# Patient Record
Sex: Male | Born: 1980 | Race: White | Hispanic: No
Health system: Midwestern US, Community
[De-identification: ages and names within clinical notes are randomized; demographics above are authoritative.]

## PROBLEM LIST (undated history)

## (undated) ENCOUNTER — Emergency Department (HOSPITAL_COMMUNITY): Admission: EM | Payer: No Typology Code available for payment source | Source: Home / Self Care

## (undated) DIAGNOSIS — E785 Hyperlipidemia, unspecified: Secondary | ICD-10-CM

## (undated) DIAGNOSIS — I10 Essential (primary) hypertension: Secondary | ICD-10-CM

## (undated) HISTORY — PX: APPENDECTOMY: SHX54

---

## 2004-05-21 ENCOUNTER — Emergency Department: Payer: Self-pay | Admitting: Emergency Medicine

## 2006-10-06 ENCOUNTER — Ambulatory Visit: Payer: Self-pay | Admitting: Internal Medicine

## 2006-12-18 ENCOUNTER — Emergency Department: Payer: Self-pay | Admitting: Emergency Medicine

## 2007-06-27 ENCOUNTER — Ambulatory Visit: Payer: Self-pay | Admitting: Family Medicine

## 2007-11-18 ENCOUNTER — Emergency Department: Payer: Self-pay | Admitting: Emergency Medicine

## 2010-03-12 ENCOUNTER — Emergency Department: Payer: Self-pay | Admitting: Emergency Medicine

## 2010-05-30 ENCOUNTER — Emergency Department: Payer: Self-pay | Admitting: Emergency Medicine

## 2010-06-18 ENCOUNTER — Emergency Department: Payer: Self-pay | Admitting: Emergency Medicine

## 2010-06-19 ENCOUNTER — Emergency Department: Payer: Self-pay | Admitting: Emergency Medicine

## 2011-03-07 ENCOUNTER — Emergency Department: Payer: Self-pay | Admitting: Unknown Physician Specialty

## 2014-06-08 ENCOUNTER — Ambulatory Visit: Payer: Self-pay | Admitting: Family Medicine

## 2018-12-03 ENCOUNTER — Other Ambulatory Visit: Payer: Self-pay

## 2018-12-03 DIAGNOSIS — Z20822 Contact with and (suspected) exposure to covid-19: Secondary | ICD-10-CM

## 2018-12-04 LAB — NOVEL CORONAVIRUS, NAA: SARS-CoV-2, NAA: NOT DETECTED

## 2019-02-22 ENCOUNTER — Other Ambulatory Visit: Payer: Self-pay

## 2019-02-22 DIAGNOSIS — Z20822 Contact with and (suspected) exposure to covid-19: Secondary | ICD-10-CM

## 2019-02-23 LAB — NOVEL CORONAVIRUS, NAA: SARS-CoV-2, NAA: DETECTED — AB

## 2019-08-30 ENCOUNTER — Ambulatory Visit (HOSPITAL_COMMUNITY)
Admission: EM | Admit: 2019-08-30 | Discharge: 2019-08-30 | Discharge status: Against Medical Advice | Payer: No Typology Code available for payment source

## 2019-08-30 ENCOUNTER — Other Ambulatory Visit: Payer: Self-pay

## 2021-05-14 NOTE — Telephone Encounter (Signed)
Pt is wanting to know if he can get blood work ordered before his appt in April.

## 2021-05-15 NOTE — Telephone Encounter (Signed)
Patient notified that labs will be done the day of appointment

## 2021-05-22 ENCOUNTER — Other Ambulatory Visit: Payer: Self-pay

## 2021-05-22 ENCOUNTER — Emergency Department (HOSPITAL_COMMUNITY)
Admission: EM | Admit: 2021-05-22 | Discharge: 2021-05-23 | Disposition: A | Discharge status: Home / Self Care | Payer: Self-pay | Attending: Emergency Medicine | Admitting: Emergency Medicine

## 2021-05-22 ENCOUNTER — Encounter (HOSPITAL_COMMUNITY): Payer: Self-pay

## 2021-05-22 ENCOUNTER — Emergency Department (HOSPITAL_COMMUNITY): Payer: Self-pay

## 2021-05-22 DIAGNOSIS — R0789 Other chest pain: Secondary | ICD-10-CM | POA: Insufficient documentation

## 2021-05-22 DIAGNOSIS — R42 Dizziness and giddiness: Secondary | ICD-10-CM | POA: Insufficient documentation

## 2021-05-22 DIAGNOSIS — R1013 Epigastric pain: Secondary | ICD-10-CM | POA: Insufficient documentation

## 2021-05-22 DIAGNOSIS — R079 Chest pain, unspecified: Secondary | ICD-10-CM

## 2021-05-22 HISTORY — DX: Essential (primary) hypertension: I10

## 2021-05-22 LAB — CBC
HCT: 45 % (ref 39.0–52.0)
Hemoglobin: 14.4 g/dL (ref 13.0–17.0)
MCH: 27 pg (ref 26.0–34.0)
MCHC: 32 g/dL (ref 30.0–36.0)
MCV: 84.4 fL (ref 80.0–100.0)
Platelets: 227 10*3/uL (ref 150–400)
RBC: 5.33 MIL/uL (ref 4.22–5.81)
RDW: 13.5 % (ref 11.5–15.5)
WBC: 6 10*3/uL (ref 4.0–10.5)
nRBC: 0 % (ref 0.0–0.2)

## 2021-05-22 LAB — BASIC METABOLIC PANEL
Anion gap: 7 (ref 5–15)
BUN: 13 mg/dL (ref 6–20)
CO2: 22 mmol/L (ref 22–32)
Calcium: 9.1 mg/dL (ref 8.9–10.3)
Chloride: 107 mmol/L (ref 98–111)
Creatinine, Ser: 0.98 mg/dL (ref 0.61–1.24)
GFR, Estimated: >60 mL/min (ref >60)
Glucose, Bld: 91 mg/dL (ref 70–99)
Potassium: 3.5 mmol/L (ref 3.5–5.1)
Sodium: 136 mmol/L (ref 135–145)

## 2021-05-22 LAB — TROPONIN I (HIGH SENSITIVITY): Troponin I (High Sensitivity): 3 ng/L (ref <18)

## 2021-05-22 NOTE — ED Triage Notes (Signed)
Ambulatory to ED with c/o chest pain and lightheadness x 2 hours. Denies hx of same.

## 2021-05-23 LAB — TROPONIN I (HIGH SENSITIVITY): Troponin I (High Sensitivity): 3 ng/L (ref <18)

## 2021-05-23 LAB — D-DIMER, QUANTITATIVE: D-Dimer, Quant: <0.27 ug/mL-FEU (ref 0.00–0.50)

## 2021-05-23 MED ORDER — ALUM & MAG HYDROXIDE-SIMETH 400-400-40 MG/5ML PO SUSP: 15.0000 mL | Freq: Four times a day (QID) | ORAL | 0 refills | Status: DC | PRN

## 2021-05-23 MED ORDER — LIDOCAINE VISCOUS HCL 2 % MT SOLN
15.0000 mL | Freq: Once | OROMUCOSAL | Status: AC
  Administered 2021-05-23: 15 mL via ORAL
  Filled 2021-05-23: qty 15

## 2021-05-23 MED ORDER — ALUM & MAG HYDROXIDE-SIMETH 200-200-20 MG/5ML PO SUSP
30.0000 mL | Freq: Once | ORAL | Status: AC
  Administered 2021-05-23: 30 mL via ORAL
  Filled 2021-05-23: qty 30

## 2021-05-23 MED ORDER — PANTOPRAZOLE SODIUM 20 MG PO TBEC: 20.0000 mg | DELAYED_RELEASE_TABLET | Freq: Every day | ORAL | 0 refills | Status: DC

## 2021-05-23 MED ORDER — PANTOPRAZOLE SODIUM 40 MG PO TBEC
40.0000 mg | DELAYED_RELEASE_TABLET | Freq: Once | ORAL | Status: AC
Start: 2021-05-23 — End: 2021-05-23
  Administered 2021-05-23: 40 mg via ORAL
  Filled 2021-05-23: qty 1

## 2021-05-23 NOTE — ED Provider Notes (Signed)
?[facility] EMERGENCY DEPARTMENT ?Provider Note ? ? ?CSN: [id number] ?Arrival date & time: 05/22/21  2029 ? ?  ? ?History ? ?Chief Complaint  ?Patient presents with  ? Chest Pain  ? ? ?Gabriel Walton is a 41 y.o. male. ? ?41 year old male who presents emerged from today with chest pain and dizziness.  Patient states that he was dizzy for a couple days but seems to have resolved now.  Patient states that he felt a bit lightheaded but once again does not have that at this time. ? ?What brought him in today is that he woke up with some chest discomfort.  Patient states he felt some pressure in his epigastric area like he had to burp.  He stated he did burp and it seemed to feel little bit better and then he took a Tums admittedly bit better but still little bit of lingering pain.  Secondary to his medical history he came here to get evaluated.  No nausea, vomiting, current lightheadedness, diaphoresis or other associated symptoms.  No known cardiac disease. ? ? ?Chest Pain ? ?  ? ?Home Medications ?Prior to Admission medications   ?Medication Sig Start Date End Date Taking? Authorizing Provider  ?alum & mag hydroxide-simeth (MAALOX PLUS) 400-400-40 MG/5ML suspension Take 15 mLs by mouth every 6 (six) hours as needed for indigestion. 05/23/21   Trevis Eden, Barbara Cower, MD  ?pantoprazole (PROTONIX) 20 MG tablet Take 1 tablet (20 mg total) by mouth daily for 14 days. 05/23/21 06/06/21  Jacqualin Shirkey, Barbara Cower, MD  ?   ? ?Allergies    ?Patient has no known allergies.   ? ?Review of Systems   ?Review of Systems  ?Cardiovascular:  Positive for chest pain.  ? ?Physical Exam ?Updated Vital Signs ?BP (!) 164/106   Pulse (!) 57   Temp 97.7 ?F (36.5 ?C) (Oral)   Resp 18   Ht 6\' 4"  (1.93 m)   Wt 129.3 kg   SpO2 97%   BMI 34.69 kg/m?  ?Physical Exam ?Vitals and nursing note reviewed.  ?Constitutional:   ?   Appearance: He is well-developed.  ?HENT:  ?   Head: Normocephalic and atraumatic.  ?Cardiovascular:  ?   Rate and Rhythm: Normal rate.   ?Pulmonary:  ?   Effort: Pulmonary effort is normal. No respiratory distress.  ?Chest:  ?   Chest wall: No mass, deformity or tenderness.  ?Abdominal:  ?   General: There is no distension.  ?   Palpations: Abdomen is soft.  ?Musculoskeletal:     ?   General: Normal range of motion.  ?   Cervical back: Normal range of motion.  ?Skin: ?   General: Skin is warm and dry.  ?Neurological:  ?   General: No focal deficit present.  ?   Mental Status: He is alert.  ? ? ?ED Results / Procedures / Treatments   ?Labs ?(all labs ordered are listed, but only abnormal results are displayed) ?Labs Reviewed  ?BASIC METABOLIC PANEL  ?CBC  ?D-DIMER, QUANTITATIVE  ?TROPONIN I (HIGH SENSITIVITY)  ?TROPONIN I (HIGH SENSITIVITY)  ? ? ?EKG ?EKG Interpretation ? ?Date/Time:  Tuesday May 22 2021 20:37:31 EST ?Ventricular Rate:  65 ?PR Interval:  202 ?QRS Duration: 78 ?QT Interval:  414 ?QTC Calculation: 430 ?R Axis:   48 ?Text Interpretation: Normal sinus rhythm Normal ECG When compared with ECG of 12-Mar-2010 19:21, No significant change was found Confirmed by 14-Mar-2010 [id number]) on 05/23/2021 12:10:24 AM ? ?Radiology ?DG Chest 2 View ? ?Result  Date: 05/22/2021 ?CLINICAL DATA:  Chest pain.  Lightheadedness. EXAM: CHEST - 2 VIEW COMPARISON:  None. FINDINGS: Cardiac silhouette and mediastinal contours are within normal limits. The lungs are clear. No pleural effusion or pneumothorax. No acute skeletal abnormality. IMPRESSION: No active cardiopulmonary disease. Electronically Signed   By: Neita Garnet M.D.   On: 05/22/2021 20:55   ? ?Procedures ?Procedures  ? ? ?Medications Ordered in ED ?Medications  ?alum & mag hydroxide-simeth (MAALOX/MYLANTA) 200-200-20 MG/5ML suspension 30 mL (30 mLs Oral Given 05/23/21 0215)  ?  And  ?lidocaine (XYLOCAINE) 2 % viscous mouth solution 15 mL (15 mLs Oral Given 05/23/21 0215)  ?pantoprazole (PROTONIX) EC tablet 40 mg (40 mg Oral Given 05/23/21 0314)  ? ? ?ED Course/ Medical Decision Making/ A&P ?  ?                         ?Medical Decision Making ?Amount and/or Complexity of Data Reviewed ?Labs: ordered. ?Radiology: ordered. ? ?Risk ?OTC drugs. ?Prescription drug management. ? ? ?Symptoms fully resolved with maalox and protonix. ACS workup reassuring. Doubt PE. Doubt infection. Overall patient appears well. Stable for discharge and pcp follow up. ? ? ?Final Clinical Impression(s) / ED Diagnoses ?Final diagnoses:  ?Nonspecific chest pain  ? ? ?Rx / DC Orders ?ED Discharge Orders   ? ?      Ordered  ?  pantoprazole (PROTONIX) 20 MG tablet  Daily,   Status:  Discontinued       ? 05/23/21 0309  ?  alum & mag hydroxide-simeth (MAALOX PLUS) 400-400-40 MG/5ML suspension  Every 6 hours PRN,   Status:  Discontinued       ? 05/23/21 0309  ?  alum & mag hydroxide-simeth (MAALOX PLUS) 400-400-40 MG/5ML suspension  Every 6 hours PRN       ? 05/23/21 0312  ?  pantoprazole (PROTONIX) 20 MG tablet  Daily       ? 05/23/21 7412  ? ?  ?  ? ?  ? ? ?  ?Marily Memos, MD ?05/23/21 [phone] ? ?

## 2021-07-10 ENCOUNTER — Encounter: Attending: Family Medicine | Primary: Family Medicine

## 2021-10-15 ENCOUNTER — Emergency Department (HOSPITAL_COMMUNITY)
Admission: EM | Admit: 2021-10-15 | Discharge: 2021-10-15 | Disposition: A | Discharge status: Home / Self Care | Payer: Self-pay | Attending: Emergency Medicine | Admitting: Emergency Medicine

## 2021-10-15 ENCOUNTER — Encounter (HOSPITAL_COMMUNITY): Payer: Self-pay | Admitting: Emergency Medicine

## 2021-10-15 DIAGNOSIS — I10 Essential (primary) hypertension: Secondary | ICD-10-CM | POA: Insufficient documentation

## 2021-10-15 DIAGNOSIS — H9209 Otalgia, unspecified ear: Secondary | ICD-10-CM | POA: Insufficient documentation

## 2021-10-15 DIAGNOSIS — J039 Acute tonsillitis, unspecified: Secondary | ICD-10-CM | POA: Insufficient documentation

## 2021-10-15 MED ORDER — AMOXICILLIN-POT CLAVULANATE 875-125 MG PO TABS: 1.0000 | ORAL_TABLET | Freq: Two times a day (BID) | ORAL | 0 refills | Status: AC

## 2021-10-15 MED ORDER — DEXAMETHASONE 4 MG PO TABS
10.0000 mg | ORAL_TABLET | Freq: Once | ORAL | Status: AC
  Administered 2021-10-15: 10 mg via ORAL
  Filled 2021-10-15: qty 3

## 2021-10-15 MED ORDER — AMOXICILLIN-POT CLAVULANATE 875-125 MG PO TABS
1.0000 | ORAL_TABLET | Freq: Once | ORAL | Status: AC
  Administered 2021-10-15: 1 via ORAL
  Filled 2021-10-15: qty 1

## 2021-10-15 MED ORDER — NAPROXEN 500 MG PO TABS: 500.0000 mg | ORAL_TABLET | Freq: Two times a day (BID) | ORAL | 0 refills | Status: DC

## 2021-10-15 MED ORDER — ACETAMINOPHEN 500 MG PO TABS
1000.0000 mg | ORAL_TABLET | Freq: Once | ORAL | Status: AC
Start: 2021-10-15 — End: 2021-10-15
  Administered 2021-10-15: 1000 mg via ORAL
  Filled 2021-10-15: qty 2

## 2021-10-15 NOTE — ED Provider Notes (Signed)
[facility]Y DEPT [facility] Emergency Department Provider Note MRN:  [id number]  Arrival date & time: 10/15/21     Chief Complaint   Otalgia and Sore Throat   History of Present Illness   Gabriel Walton is a 41 y.o. year-old male with a history of hypertension presenting to the ED with chief complaint of sore throat.  Sore throat for the past few days, worse on the right.  Having some ear discomfort as well.  Pain getting much worse this evening, having a lot of pain with swallowing.  No shortness of breath, no fever.  Review of Systems  A thorough review of systems was obtained and all systems are negative except as noted in the HPI and PMH.   Patient's Health History    Past Medical History:  Diagnosis Date   Hypertension     History reviewed. No pertinent surgical history.  History reviewed. No pertinent family history.  Social History   Socioeconomic History   Marital status: Single    Spouse name: Not on file   Number of children: Not on file   Years of education: Not on file   Highest education level: Not on file  Occupational History   Not on file  Tobacco Use   Smoking status: Never   Smokeless tobacco: Never  Substance and Sexual Activity   Alcohol use: Yes   Drug use: Not on file   Sexual activity: Not on file  Other Topics Concern   Not on file  Social History Narrative   Not on file   Social Determinants of Health   Financial Resource Strain: Not on file  Food Insecurity: Not on file  Transportation Needs: Not on file  Physical Activity: Not on file  Stress: Not on file  Social Connections: Not on file  Intimate Partner Violence: Not on file     Physical Exam   Vitals:   10/15/21 0554 10/15/21 0555  BP: (!) 141/94   Pulse: 68 69  Resp: 14   Temp: 98.3 F (36.8 C)   SpO2: 98% 98%    CONSTITUTIONAL: Well-appearing, NAD NEURO/PSYCH:  Alert and oriented x 3, no focal deficits EYES:  eyes equal and reactive ENT/NECK:  no LAD, no  JVD, fullness and erythema to the right peritonsillar region CARDIO: Regular rate, well-perfused, normal S1 and S2 PULM:  CTAB no wheezing or rhonchi GI/GU:  non-distended, non-tender MSK/SPINE:  No gross deformities, no edema SKIN:  no rash, atraumatic   *Additional and/or pertinent findings included in MDM below  Diagnostic and Interventional Summary    EKG Interpretation  Date/Time:    Ventricular Rate:    PR Interval:    QRS Duration:   QT Interval:    QTC Calculation:   R Axis:     Text Interpretation:         Labs Reviewed - No data to display  No orders to display    Medications  dexamethasone (DECADRON) tablet 10 mg (has no administration in time range)  amoxicillin-clavulanate (AUGMENTIN) 875-125 MG per tablet 1 tablet (has no administration in time range)  acetaminophen (TYLENOL) tablet 1,000 mg (has no administration in time range)     Procedures  /  Critical Care Procedures  ED Course and Medical Decision Making  Initial Impression and Ddx Exam suspicious for tonsillitis versus small peritonsillar abscess.  TM on the right generally appears normal.  Normal vital signs, well-appearing, no acute distress, seems appropriate for antibiotics, strict return precautions  Past medical/surgical  history that increases complexity of ED encounter: Hypertension  Interpretation of Diagnostics Laboratory and/or imaging options to aid in the diagnosis/care of the patient were considered.  After careful history and physical examination, it was determined that there was no indication for diagnostics at this time.  Patient Reassessment and Ultimate Disposition/Management     Discharge home  Patient management required discussion with the following services or consulting groups:  None  Complexity of Problems Addressed Acute illness or injury that poses threat of life of bodily function  Additional Data Reviewed and Analyzed Further history obtained  from: None  Additional Factors Impacting ED Encounter Risk Prescriptions  Elmer Sow. Pilar Plate, MD [facility] Emergency Medicine [facility] [email or web address]  Final Clinical Impressions(s) / ED Diagnoses     ICD-10-CM   1. Tonsillitis  J03.90       ED Discharge Orders          Ordered    amoxicillin-clavulanate (AUGMENTIN) 875-125 MG tablet  Every 12 hours        10/15/21 0611    naproxen (NAPROSYN) 500 MG tablet  2 times daily        10/15/21 0998             Discharge Instructions Discussed with and Provided to Patient:    Discharge Instructions      You were evaluated in the Emergency Department and after careful evaluation, we did not find any emergent condition requiring admission or further testing in the hospital.  Your exam/testing today is overall reassuring.  Symptoms seem to be due to a tonsil infection, or possibly a small tonsil abscess.  Take the Augmentin antibiotic as directed.  Use the Naprosyn anti-inflammatory for pain.  Please return to the Emergency Department if you experience any worsening of your condition.   Thank you for allowing [place] to be a part of your care.      Sabas Sous, MD 10/15/21 [phone]

## 2021-10-15 NOTE — Discharge Instructions (Signed)
You were evaluated in the Emergency Department and after careful evaluation, we did not find any emergent condition requiring admission or further testing in the hospital.  Your exam/testing today is overall reassuring.  Symptoms seem to be due to a tonsil infection, or possibly a small tonsil abscess.  Take the Augmentin antibiotic as directed.  Use the Naprosyn anti-inflammatory for pain.  Please return to the Emergency Department if you experience any worsening of your condition.   Thank you for allowing [place] to be a part of your care.

## 2021-10-15 NOTE — ED Triage Notes (Signed)
Pt arrives POV c/o sore throat x2 days now with ear pain that started today.

## 2021-12-03 ENCOUNTER — Ambulatory Visit: Admit: 2021-12-03 | Discharge: 2021-12-03 | Attending: Family Medicine | Primary: Family Medicine

## 2021-12-03 DIAGNOSIS — Z0000 Encounter for general adult medical examination without abnormal findings: Secondary | ICD-10-CM

## 2021-12-03 NOTE — Progress Notes (Signed)
Patient Consent? Y    Site?RIGHT AC     Site Prepped and Cleaned?Y    Needle Gauge Type? STRAIGHT    Needle Gauge Size?22    Pressure Applied to Site?Y    Number of Tubes Collected?1    Number of Attempts?1    Receiving Lab Location?[facility]    Patient Tolerated?Y  DS      Procedure Code 36415

## 2021-12-03 NOTE — Progress Notes (Signed)
Donald Banks (DOB:  11-24-1980) is a 41 y.o. male, here for evaluation of the following chief complaint(s):  Follow-up        ASSESSMENT/PLAN:  1. Annual physical exam  Overview:  Discussed age appropriate labs, screening tests and vaccines. BMI reviewed and discussed. Depression screen reviewed and is negative unless notes above.    2. Screening for hyperlipidemia  -     Comprehensive Metabolic Panel  -     Lipid Panel      No results found for any visits on 12/03/21.    Return in about 1 year (around 12/04/2022).    Vitals:    12/03/21 1306   BP: 118/78   Site: Left Upper Arm   Position: Sitting   Cuff Size: Large Adult   Pulse: 64   Temp: 98.2 F (36.8 C)   TempSrc: Oral   SpO2: 98%   Weight: 221 lb (100.2 kg)   Height: 5\' 10"  (1.778 m)       Physical Exam:  Physical Exam  Constitutional:       Appearance: Normal appearance.   HENT:      Head: Normocephalic and atraumatic.   Eyes:      Extraocular Movements: Extraocular movements intact.      Pupils: Pupils are equal, round, and reactive to light.   Cardiovascular:      Rate and Rhythm: Normal rate and regular rhythm.      Heart sounds: No murmur heard.     No friction rub. No gallop.   Pulmonary:      Effort: Pulmonary effort is normal.      Breath sounds: Normal breath sounds.   Musculoskeletal:         General: No swelling.   Skin:     General: Skin is warm and dry.   Neurological:      Mental Status: He is alert.   Psychiatric:         Mood and Affect: Mood normal.           No current outpatient medications on file.     No current facility-administered medications for this visit.        PHQ-9 Total Score: 1 (12/03/2021  1:06 PM)       No past medical history on file.     No past surgical history on file.    No family history on file.    History of Present Illness: See A/P for details.    Allergies:  No Known Allergies  Health Maintenance   Topic Date Due    COVID-19 Vaccine (1) Never done    Varicella vaccine (1 of 2 - 2-dose childhood series) Never done     Depression Screen  Never done    HIV screen  Never done    Hepatitis C screen  Never done    DTaP/Tdap/Td vaccine (1 - Tdap) Never done    Hepatitis B vaccine (3 of 3 - Hep B Twinrix 3-dose series) 09/30/2005    Diabetes screen  Never done    Lipids  Never done    Flu vaccine (1) 10/23/2021    Orthopox (Smallpox/Monkeypox) Vaccine  Completed    Hepatitis A vaccine  Aged Out    Hib vaccine  Aged Out    HPV vaccine  Aged Out    Meningococcal (ACWY) vaccine  Aged Out    Pneumococcal 0-64 years Vaccine  Aged 12/23/2021 signed by:  --  Jonathon Resides, MD

## 2021-12-04 LAB — COMPREHENSIVE METABOLIC PANEL
ALT: 28 U/L (ref 0–50)
AST: 34 U/L (ref 0–50)
Albumin/Globulin Ratio: 1.5 (ref 1.00–2.70)
Albumin: 4.6 g/dL (ref 3.5–5.2)
Alk Phosphatase: 56 U/L (ref 40–130)
Anion Gap: 13 mmol/L (ref 2–17)
BUN: 12 mg/dL (ref 6–20)
CO2: 26 mmol/L (ref 22–29)
Calcium: 9.4 mg/dL (ref 8.6–10.0)
Chloride: 101 mmol/L (ref 98–107)
Creatinine: 1.1 mg/dL (ref 0.7–1.3)
Est, Glom Filt Rate: 86 mL/min/1.73m (ref >=60)
Globulin: 3 g/dL (ref 1.9–4.4)
Glucose: 109 mg/dL — ABNORMAL HIGH (ref 70–99)
OSMOLALITY CALCULATED: 280 mOsm/kg (ref 270–287)
Potassium: 4.4 mmol/L (ref 3.5–5.3)
Sodium: 140 mmol/L (ref 135–145)
Total Bilirubin: 0.55 mg/dL (ref 0.00–1.20)
Total Protein: 7.6 g/dL (ref 6.4–8.3)

## 2021-12-04 LAB — LIPID PANEL
Chol/HDL Ratio: 3.5 (ref 0.0–4.4)
Cholesterol: 228 mg/dL — ABNORMAL HIGH (ref 100–200)
HDL: 65 mg/dL (ref >=40)
LDL Cholesterol: 131.6 mg/dL — ABNORMAL HIGH (ref 0.0–100.0)
LDL/HDL Ratio: 2
Triglycerides: 157 mg/dL — ABNORMAL HIGH (ref 0–149)
VLDL: 31.4 mg/dL (ref 5.0–40.0)

## 2022-08-14 ENCOUNTER — Emergency Department (HOSPITAL_COMMUNITY)
Admission: EM | Admit: 2022-08-14 | Discharge: 2022-08-14 | Disposition: A | Discharge status: Home / Self Care | Payer: BC Managed Care – PPO | Attending: Emergency Medicine | Admitting: Emergency Medicine

## 2022-08-14 ENCOUNTER — Encounter (HOSPITAL_COMMUNITY): Payer: Self-pay | Admitting: Emergency Medicine

## 2022-08-14 ENCOUNTER — Other Ambulatory Visit: Payer: Self-pay

## 2022-08-14 DIAGNOSIS — W57XXXA Bitten or stung by nonvenomous insect and other nonvenomous arthropods, initial encounter: Secondary | ICD-10-CM | POA: Diagnosis not present

## 2022-08-14 DIAGNOSIS — R112 Nausea with vomiting, unspecified: Secondary | ICD-10-CM | POA: Diagnosis not present

## 2022-08-14 DIAGNOSIS — S41052A Open bite of left shoulder, initial encounter: Secondary | ICD-10-CM | POA: Insufficient documentation

## 2022-08-14 DIAGNOSIS — S40262A Insect bite (nonvenomous) of left shoulder, initial encounter: Secondary | ICD-10-CM

## 2022-08-14 DIAGNOSIS — I10 Essential (primary) hypertension: Secondary | ICD-10-CM | POA: Insufficient documentation

## 2022-08-14 LAB — CBC WITH DIFFERENTIAL/PLATELET
Abs Immature Granulocytes: 0.01 10*3/uL (ref 0.00–0.07)
Basophils Absolute: 0 10*3/uL (ref 0.0–0.1)
Basophils Relative: 1 %
Eosinophils Absolute: 0.2 10*3/uL (ref 0.0–0.5)
Eosinophils Relative: 3 %
HCT: 44.4 % (ref 39.0–52.0)
Hemoglobin: 14.3 g/dL (ref 13.0–17.0)
Immature Granulocytes: 0 %
Lymphocytes Relative: 39 %
Lymphs Abs: 2 10*3/uL (ref 0.7–4.0)
MCH: 26.9 pg (ref 26.0–34.0)
MCHC: 32.2 g/dL (ref 30.0–36.0)
MCV: 83.6 fL (ref 80.0–100.0)
Monocytes Absolute: 0.5 10*3/uL (ref 0.1–1.0)
Monocytes Relative: 10 %
Neutro Abs: 2.5 10*3/uL (ref 1.7–7.7)
Neutrophils Relative %: 47 %
Platelets: 210 10*3/uL (ref 150–400)
RBC: 5.31 MIL/uL (ref 4.22–5.81)
RDW: 13.8 % (ref 11.5–15.5)
WBC: 5.2 10*3/uL (ref 4.0–10.5)
nRBC: 0 % (ref 0.0–0.2)

## 2022-08-14 LAB — BASIC METABOLIC PANEL
Anion gap: 8 (ref 5–15)
BUN: 15 mg/dL (ref 6–20)
CO2: 27 mmol/L (ref 22–32)
Calcium: 9 mg/dL (ref 8.9–10.3)
Chloride: 103 mmol/L (ref 98–111)
Creatinine, Ser: 0.96 mg/dL (ref 0.61–1.24)
GFR, Estimated: >60 mL/min (ref >60)
Glucose, Bld: 104 mg/dL — ABNORMAL HIGH (ref 70–99)
Potassium: 4.2 mmol/L (ref 3.5–5.1)
Sodium: 138 mmol/L (ref 135–145)

## 2022-08-14 MED ORDER — ONDANSETRON HCL 4 MG/2ML IJ SOLN: 4.0000 mg | Freq: Once | INTRAMUSCULAR | Status: DC

## 2022-08-14 MED ORDER — DOXYCYCLINE HYCLATE 100 MG PO TABS
100.0000 mg | ORAL_TABLET | Freq: Once | ORAL | Status: AC
  Administered 2022-08-14: 100 mg via ORAL
  Filled 2022-08-14: qty 1

## 2022-08-14 MED ORDER — ONDANSETRON 4 MG PO TBDP
4.0000 mg | ORAL_TABLET | Freq: Once | ORAL | Status: AC
  Administered 2022-08-14: 4 mg via ORAL
  Filled 2022-08-14: qty 1

## 2022-08-14 MED ORDER — ONDANSETRON 4 MG PO TBDP: 4.0000 mg | ORAL_TABLET | Freq: Four times a day (QID) | ORAL | 0 refills | Status: DC | PRN

## 2022-08-14 MED ORDER — DOXYCYCLINE HYCLATE 100 MG PO CAPS
100.0000 mg | ORAL_CAPSULE | Freq: Two times a day (BID) | ORAL | 0 refills | Status: AC
Start: 2022-08-14 — End: 2022-08-21

## 2022-08-14 NOTE — ED Provider Notes (Signed)
[place] EMERGENCY DEPARTMENT AT [facility] Provider Note   CSN: [id number] Arrival date & time: 08/14/22  9629     History  Chief Complaint  Patient presents with   Tick Removal    Gabriel Walton is a 42 y.o. male.  He has PMH of hypertension.  Presents the ER today for nausea vomiting.  States that started this morning.  No fevers or chills no abdominal pain no urinary symptoms. Patient is concerned because 6 days ago he pulled several ticks off of his left shoulder.  The next day his son pulled a large engorged tick off of his left upper back.  He states this area has redness and swelling around it.  He has not had any other rash, no headache, no fever, no dizziness or other complaints.  HPI     Home Medications Prior to Admission medications   Medication Sig Start Date End Date Taking? Authorizing Provider  doxycycline (VIBRAMYCIN) 100 MG capsule Take 1 capsule (100 mg total) by mouth 2 (two) times daily for 7 days. 08/14/22 08/21/22 Yes Kelby Lotspeich A, PA-C  ondansetron (ZOFRAN-ODT) 4 MG disintegrating tablet Take 1 tablet (4 mg total) by mouth every 6 (six) hours as needed for nausea or vomiting. 08/14/22  Yes Kathyjo Briere A, PA-C  alum & mag hydroxide-simeth (MAALOX PLUS) 400-400-40 MG/5ML suspension Take 15 mLs by mouth every 6 (six) hours as needed for indigestion. 05/23/21   Mesner, Barbara Cower, MD  naproxen (NAPROSYN) 500 MG tablet Take 1 tablet (500 mg total) by mouth 2 (two) times daily. 10/15/21   Sabas Sous, MD  pantoprazole (PROTONIX) 20 MG tablet Take 1 tablet (20 mg total) by mouth daily for 14 days. 05/23/21 06/06/21  Mesner, Barbara Cower, MD      Allergies    Patient has no known allergies.    Review of Systems   Review of Systems  Physical Exam Updated Vital Signs BP (!) 152/95 (BP Location: Left Arm)   Pulse 60   Temp 98.7 F (37.1 C) (Oral)   Resp 16   Ht 6\' 4"  (1.93 m)   Wt 120.2 kg   SpO2 98%   BMI 32.26 kg/m  Physical Exam Vitals and  nursing note reviewed.  Constitutional:      General: He is not in acute distress.    Appearance: He is well-developed.  HENT:     Head: Normocephalic and atraumatic.     Mouth/Throat:     Mouth: Mucous membranes are moist.  Eyes:     Conjunctiva/sclera: Conjunctivae normal.  Cardiovascular:     Rate and Rhythm: Normal rate and regular rhythm.     Heart sounds: No murmur heard. Pulmonary:     Effort: Pulmonary effort is normal. No respiratory distress.     Breath sounds: Normal breath sounds. No wheezing.  Abdominal:     Palpations: Abdomen is soft.     Tenderness: There is no abdominal tenderness.  Musculoskeletal:        General: No swelling. Normal range of motion.     Cervical back: Neck supple.  Skin:    General: Skin is warm and dry.     Capillary Refill: Capillary refill takes less than 2 seconds.     Comments: Left scapular area has approximately 3 cm diameter area of erythema with a central puncta.  There is no central clearing.  No induration or fluctuance.  Neurological:     General: No focal deficit present.  Mental Status: He is alert and oriented to person, place, and time.  Psychiatric:        Mood and Affect: Mood normal.     ED Results / Procedures / Treatments   Labs (all labs ordered are listed, but only abnormal results are displayed) Labs Reviewed  BASIC METABOLIC PANEL - Abnormal; Notable for the following components:      Result Value   Glucose, Bld 104 (*)    All other components within normal limits  CBC WITH DIFFERENTIAL/PLATELET    EKG None  Radiology No results found.  Procedures Procedures    Medications Ordered in ED Medications  ondansetron (ZOFRAN-ODT) disintegrating tablet 4 mg (4 mg Oral Given 08/14/22 0923)  doxycycline (VIBRA-TABS) tablet 100 mg (100 mg Oral Given 08/14/22 1008)    ED Course/ Medical Decision Making/ A&P                             Medical Decision Making DDX: RMSF,  ehrlichiosis, lyme disease,  cellulitis, gastritis, gastroenteritis, other ED course: [place] with nausea vomiting this morning, had several ticks on him last week.  The first several remove the day he noticed some, but he had 1 on his back if he did not notice until the next day that was engorged when it was pulled off.  He does have some redness and warmth around this area and is having nausea and vomiting today.  No other rash, no fevers, no abdominal pain, no urinary symptoms,.  He is well-appearing on exam.  No indication for imaging. Discussed with patient could be mild cellulitis versus tickborne illness.  Since the tick was on for over 24 hours and he is having some vague systemic symptoms we will treat empirically with doxycycline.  Medications: Patient received Zofran emergency department for nausea.  After reevaluation, he is feeling much better.  Amount and/or Complexity of Data Reviewed Labs: ordered.    Details: CBC and BMP ordered, results are normal  Risk Prescription drug management.           Final Clinical Impression(s) / ED Diagnoses Final diagnoses:  Tick bite of left shoulder, initial encounter  Nausea and vomiting, unspecified vomiting type    Rx / DC Orders ED Discharge Orders          Ordered    doxycycline (VIBRAMYCIN) 100 MG capsule  2 times daily        08/14/22 1012    ondansetron (ZOFRAN-ODT) 4 MG disintegrating tablet  Every 6 hours PRN        08/14/22 [address] PA-C 08/14/22 1012    Bethann Berkshire, MD 08/15/22 1655

## 2022-08-14 NOTE — Discharge Instructions (Addendum)
You are seen today for nausea and vomiting and a tick bite.  There is some redness to the area.  Your blood work was reassuring.  We are treating you with doxycycline which would treat a skin infection as well as a tickborne illness.  Use the Zofran as needed for nausea.  Follow-up with your primary care doctor, come back for new or worsening symptoms.

## 2022-08-14 NOTE — ED Triage Notes (Signed)
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 Tics were remove Thursday and Friday. Tics have been removed, but patient woke up this morning with nausea and vomiting.

## 2022-11-19 ENCOUNTER — Emergency Department (HOSPITAL_COMMUNITY)
Admission: EM | Admit: 2022-11-19 | Discharge: 2022-11-19 | Disposition: A | Discharge status: Home / Self Care | Payer: BC Managed Care – PPO | Attending: Emergency Medicine | Admitting: Emergency Medicine

## 2022-11-19 ENCOUNTER — Other Ambulatory Visit: Payer: Self-pay

## 2022-11-19 ENCOUNTER — Encounter (HOSPITAL_COMMUNITY): Payer: Self-pay | Admitting: *Deleted

## 2022-11-19 DIAGNOSIS — S40861A Insect bite (nonvenomous) of right upper arm, initial encounter: Secondary | ICD-10-CM | POA: Insufficient documentation

## 2022-11-19 DIAGNOSIS — W57XXXA Bitten or stung by nonvenomous insect and other nonvenomous arthropods, initial encounter: Secondary | ICD-10-CM | POA: Diagnosis not present

## 2022-11-19 DIAGNOSIS — R202 Paresthesia of skin: Secondary | ICD-10-CM | POA: Insufficient documentation

## 2022-11-19 MED ORDER — BACITRACIN ZINC 500 UNIT/GM EX OINT
TOPICAL_OINTMENT | Freq: Two times a day (BID) | CUTANEOUS | Status: DC
  Administered 2022-11-19: 1 via TOPICAL
  Filled 2022-11-19: qty 0.9

## 2022-11-19 NOTE — ED Notes (Signed)
Patient Alert and oriented to baseline. Stable and ambulatory to baseline. Patient verbalized understanding of the discharge instructions.  Patient belongings were taken by the patient.   

## 2022-11-19 NOTE — ED Triage Notes (Signed)
Pt reports he was bitten by something yesterday on his right anterior wrist/forearm and today he woke up and has pain and a knot to that area and tingling to his right hand.

## 2022-11-19 NOTE — ED Provider Notes (Signed)
[place] EMERGENCY DEPARTMENT AT [facility] Provider Note   CSN: [id number] Arrival date & time: 11/19/22  4098     History  Chief Complaint  Patient presents with   Hand Problem    Gabriel Walton is a 42 y.o. male.  42 year old male previously healthy presents emergency department with bug bite on his right arm.  Reports that he was clearing some brush yesterday for work and felt an insect sting his right arm.  Said that since then he noticed a small bump this morning and has been having tingling in his fingers on the right side.  No fevers.  No rash.       Home Medications Prior to Admission medications   Medication Sig Start Date End Date Taking? Authorizing Provider  alum & mag hydroxide-simeth (MAALOX PLUS) 400-400-40 MG/5ML suspension Take 15 mLs by mouth every 6 (six) hours as needed for indigestion. 05/23/21   Mesner, Barbara Cower, MD  naproxen (NAPROSYN) 500 MG tablet Take 1 tablet (500 mg total) by mouth 2 (two) times daily. 10/15/21   Sabas Sous, MD  ondansetron (ZOFRAN-ODT) 4 MG disintegrating tablet Take 1 tablet (4 mg total) by mouth every 6 (six) hours as needed for nausea or vomiting. 08/14/22   Carmel Sacramento A, PA-C  pantoprazole (PROTONIX) 20 MG tablet Take 1 tablet (20 mg total) by mouth daily for 14 days. 05/23/21 06/06/21  Mesner, Barbara Cower, MD      Allergies    Patient has no known allergies.    Review of Systems   Review of Systems  Physical Exam Updated Vital Signs BP (!) 134/98 (BP Location: Right Arm)   Pulse (!) 55   Temp 98.2 F (36.8 C) (Oral)   Resp 14   Ht 6\' 4"  (1.93 m)   Wt 120.2 kg   SpO2 100%   BMI 32.26 kg/m  Physical Exam Vitals and nursing note reviewed.  Constitutional:      General: He is not in acute distress.    Appearance: He is well-developed.  HENT:     Head: Normocephalic and atraumatic.     Right Ear: External ear normal.     Left Ear: External ear normal.     Nose: Nose normal.  Eyes:     Extraocular  Movements: Extraocular movements intact.     Conjunctiva/sclera: Conjunctivae normal.     Pupils: Pupils are equal, round, and reactive to light.  Cardiovascular:     Rate and Rhythm: Normal rate and regular rhythm.  Pulmonary:     Effort: Pulmonary effort is normal. No respiratory distress.  Musculoskeletal:     Cervical back: Normal range of motion and neck supple.     Right lower leg: No edema.     Left lower leg: No edema.     Comments: Symmetrically palpable radial and ulnar pulses. Capillary refill <2 seconds to all digits.  Intact sensation to light touch of the radial, median and ulnar nerves demonstrated by testing in the dorsal web space of the thumb, the hypothenar eminence of the palm, and the radial aspect of the dorsum of the hand.  Intact motor function of the radial, median and ulnar nerves demonstrated by strength of hand grip, and spreading of the 2nd through 5th digits, thumb apposition, and ability to make "OK sign".  No snuffbox or other bony TTP of upper extremity.   Small papule on right forearm.  No significant induration, erythema, warmth.  No fluctuance.  Skin:  General: Skin is warm and dry.  Neurological:     Mental Status: He is alert. Mental status is at baseline.  Psychiatric:        Mood and Affect: Mood normal.        Behavior: Behavior normal.     ED Results / Procedures / Treatments   Labs (all labs ordered are listed, but only abnormal results are displayed) Labs Reviewed - No data to display  EKG None  Radiology No results found.  Procedures Procedures    Medications Ordered in ED Medications - No data to display   ED Course/ Medical Decision Making/ A&P                                 Medical Decision Making Risk OTC drugs.   Gabriel Walton is a 42 y.o. male previously healthy presents emergency department due to concerns for a bug bite on the right arm and some paresthesias of his hand  Initial Ddx:  Insect bite,  cellulitis, abscess, paresthesias  MDM/Course:  Patient presented to the emergency department with a bug bite on his right arm with paresthesias.  Does have a small area that appears that there was a bug bite.  No significant induration, warmth, or erythema or fluctuance to suggest cellulitis or an abscess.  Upon re-evaluation patient remained stable.  Was given some bacitracin in the emergency department and instructed to place Neosporin on his bug bite.  Will have him follow-up with his primary doctor in several days.  This patient presents to the ED for concern of complaints listed in HPI, this involves an extensive number of treatment options, and is a complaint that carries with it a high risk of complications and morbidity. Disposition including potential need for admission considered.   Dispo: DC Home. Return precautions discussed including, but not limited to, those listed in the AVS. Allowed pt time to ask questions which were answered fully prior to dc.  Records reviewed Outpatient Clinic Notes I have reviewed the patients home medications and made adjustments as needed        Final Clinical Impression(s) / ED Diagnoses Final diagnoses:  Bug bite, initial encounter    Rx / DC Orders ED Discharge Orders     None         Rondel Baton, MD 11/19/22 2040

## 2022-11-19 NOTE — Discharge Instructions (Signed)
You were seen for your bug bite. Please follow-up with your primary doctor in 2 to 3 days regarding your symptoms.  Return to the emergency department if you start experiencing any open wounds, fevers, worsening pain, or any other concerning symptoms.

## 2023-03-16 ENCOUNTER — Emergency Department (HOSPITAL_COMMUNITY): Payer: BC Managed Care – PPO

## 2023-03-16 ENCOUNTER — Emergency Department (HOSPITAL_COMMUNITY)
Admission: EM | Admit: 2023-03-16 | Discharge: 2023-03-17 | Disposition: A | Discharge status: Home / Self Care | Payer: BC Managed Care – PPO | Attending: Emergency Medicine | Admitting: Emergency Medicine

## 2023-03-16 ENCOUNTER — Other Ambulatory Visit: Payer: Self-pay

## 2023-03-16 ENCOUNTER — Encounter (HOSPITAL_COMMUNITY): Payer: Self-pay

## 2023-03-16 DIAGNOSIS — R079 Chest pain, unspecified: Secondary | ICD-10-CM | POA: Diagnosis present

## 2023-03-16 HISTORY — DX: Hyperlipidemia, unspecified: E78.5

## 2023-03-16 LAB — CBC WITH DIFFERENTIAL/PLATELET
Abs Immature Granulocytes: 0.01 10*3/uL (ref 0.00–0.07)
Basophils Absolute: 0 10*3/uL (ref 0.0–0.1)
Basophils Relative: 1 %
Eosinophils Absolute: 0.2 10*3/uL (ref 0.0–0.5)
Eosinophils Relative: 3 %
HCT: 44.1 % (ref 39.0–52.0)
Hemoglobin: 14.2 g/dL (ref 13.0–17.0)
Immature Granulocytes: 0 %
Lymphocytes Relative: 34 %
Lymphs Abs: 2.2 10*3/uL (ref 0.7–4.0)
MCH: 26.8 pg (ref 26.0–34.0)
MCHC: 32.2 g/dL (ref 30.0–36.0)
MCV: 83.4 fL (ref 80.0–100.0)
Monocytes Absolute: 0.6 10*3/uL (ref 0.1–1.0)
Monocytes Relative: 9 %
Neutro Abs: 3.5 10*3/uL (ref 1.7–7.7)
Neutrophils Relative %: 53 %
Platelets: 208 10*3/uL (ref 150–400)
RBC: 5.29 MIL/uL (ref 4.22–5.81)
RDW: 13.9 % (ref 11.5–15.5)
WBC: 6.5 10*3/uL (ref 4.0–10.5)
nRBC: 0 % (ref 0.0–0.2)

## 2023-03-16 MED ORDER — KETOROLAC TROMETHAMINE 30 MG/ML IJ SOLN
30.0000 mg | Freq: Once | INTRAMUSCULAR | Status: AC
  Administered 2023-03-17: 30 mg via INTRAVENOUS
  Filled 2023-03-16: qty 1

## 2023-03-16 NOTE — ED Triage Notes (Signed)
Pt arrived via POV c/o chest pain that began earlier today. Pt denies recent injury, denies N/V, but Pt does report possible left shoulder discomfort.

## 2023-03-16 NOTE — ED Provider Notes (Signed)
[place] EMERGENCY DEPARTMENT AT [facility] Provider Note   CSN: [id number] Arrival date & time: 03/16/23  2325     History {Add pertinent medical, surgical, social history, OB history to HPI:1} Chief Complaint  Patient presents with   Chest Pain    Gabriel Walton is a 42 y.o. male.  Presents to the emergency department for evaluation of chest pain.  Patient reports that the pain began around 230 or 3 PM.  It has been present consistently since that time.  He reports that it worsened tonight, has noticed that it seems worse when he lies down.  No associated shortness of breath.       Home Medications Prior to Admission medications   Medication Sig Start Date End Date Taking? Authorizing Provider  atorvastatin (LIPITOR) 20 MG tablet Take 20 mg by mouth at bedtime. 12/28/22  Yes [provider]  valsartan-hydrochlorothiazide (DIOVAN-HCT) 80-12.5 MG tablet Take 1 tablet by mouth daily. 03/06/23  Yes [provider]  alum & mag hydroxide-simeth (MAALOX PLUS) 400-400-40 MG/5ML suspension Take 15 mLs by mouth every 6 (six) hours as needed for indigestion. 05/23/21   Mesner, Barbara Cower, MD  naproxen (NAPROSYN) 500 MG tablet Take 1 tablet (500 mg total) by mouth 2 (two) times daily. 10/15/21   Sabas Sous, MD  ondansetron (ZOFRAN-ODT) 4 MG disintegrating tablet Take 1 tablet (4 mg total) by mouth every 6 (six) hours as needed for nausea or vomiting. 08/14/22   Carmel Sacramento A, PA-C  pantoprazole (PROTONIX) 20 MG tablet Take 1 tablet (20 mg total) by mouth daily for 14 days. 05/23/21 06/06/21  Mesner, Barbara Cower, MD      Allergies    Patient has no known allergies.    Review of Systems   Review of Systems  Physical Exam Updated Vital Signs BP (!) 154/104 (BP Location: Left Arm)   Pulse 72   Temp 98.2 F (36.8 C) (Oral)   Resp 18   Ht 6\' 4"  (1.93 m)   Wt 120 kg   SpO2 99%   BMI 32.20 kg/m  Physical Exam Vitals and nursing note reviewed.  Constitutional:       General: He is not in acute distress.    Appearance: He is well-developed.  HENT:     Head: Normocephalic and atraumatic.     Mouth/Throat:     Mouth: Mucous membranes are moist.  Eyes:     General: Vision grossly intact. Gaze aligned appropriately.     Extraocular Movements: Extraocular movements intact.     Conjunctiva/sclera: Conjunctivae normal.  Cardiovascular:     Rate and Rhythm: Normal rate and regular rhythm.     Pulses: Normal pulses.     Heart sounds: Normal heart sounds, S1 normal and S2 normal. No murmur heard.    No friction rub. No gallop.  Pulmonary:     Effort: Pulmonary effort is normal. No respiratory distress.     Breath sounds: Normal breath sounds.  Chest:     Chest wall: Tenderness present.  Abdominal:     Palpations: Abdomen is soft.     Tenderness: There is no abdominal tenderness. There is no guarding or rebound.     Hernia: No hernia is present.  Musculoskeletal:        General: No swelling.     Cervical back: Full passive range of motion without pain, normal range of motion and neck supple. No pain with movement, spinous process tenderness or muscular tenderness. Normal range of motion.  Right lower leg: No edema.     Left lower leg: No edema.  Skin:    General: Skin is warm and dry.     Capillary Refill: Capillary refill takes less than 2 seconds.     Findings: No ecchymosis, erythema, lesion or wound.  Neurological:     Mental Status: He is alert and oriented to person, place, and time.     GCS: GCS eye subscore is 4. GCS verbal subscore is 5. GCS motor subscore is 6.     Cranial Nerves: Cranial nerves 2-12 are intact.     Sensory: Sensation is intact.     Motor: Motor function is intact. No weakness or abnormal muscle tone.     Coordination: Coordination is intact.  Psychiatric:        Mood and Affect: Mood normal.        Speech: Speech normal.        Behavior: Behavior normal.     ED Results / Procedures / Treatments    Labs (all labs ordered are listed, but only abnormal results are displayed) Labs Reviewed  BASIC METABOLIC PANEL  CBC WITH DIFFERENTIAL/PLATELET  TROPONIN I (HIGH SENSITIVITY)    EKG EKG Interpretation Date/Time:  Sunday March 16 2023 23:36:48 EST Ventricular Rate:  74 PR Interval:  214 QRS Duration:  87 QT Interval:  402 QTC Calculation: 446 R Axis:   76  Text Interpretation: Sinus rhythm Prolonged PR interval Minimal ST elevation, anterior leads No significant change since last tracing Confirmed by Gilda Crease [phone]) on 03/16/2023 11:39:26 PM  Radiology No results found.  Procedures Procedures  {Document cardiac monitor, telemetry assessment procedure when appropriate:1}  Medications Ordered in ED Medications - No data to display  ED Course/ Medical Decision Making/ A&P   {   Click here for ABCD2, HEART and other calculatorsREFRESH Note before signing :1}                              Medical Decision Making Amount and/or Complexity of Data Reviewed Labs: ordered. Radiology: ordered.   ***  {Document critical care time when appropriate:1} {Document review of labs and clinical decision tools ie heart score, Chads2Vasc2 etc:1}  {Document your independent review of radiology images, and any outside records:1} {Document your discussion with family members, caretakers, and with consultants:1} {Document social determinants of health affecting pt's care:1} {Document your decision making why or why not admission, treatments were needed:1} Final Clinical Impression(s) / ED Diagnoses Final diagnoses:  None    Rx / DC Orders ED Discharge Orders     None

## 2023-03-17 LAB — BASIC METABOLIC PANEL
Anion gap: 9 (ref 5–15)
BUN: 14 mg/dL (ref 6–20)
CO2: 20 mmol/L — ABNORMAL LOW (ref 22–32)
Calcium: 8.9 mg/dL (ref 8.9–10.3)
Chloride: 108 mmol/L (ref 98–111)
Creatinine, Ser: 0.96 mg/dL (ref 0.61–1.24)
GFR, Estimated: >60 mL/min (ref >60)
Glucose, Bld: 117 mg/dL — ABNORMAL HIGH (ref 70–99)
Potassium: 3.4 mmol/L — ABNORMAL LOW (ref 3.5–5.1)
Sodium: 137 mmol/L (ref 135–145)

## 2023-03-17 LAB — TROPONIN I (HIGH SENSITIVITY)
Troponin I (High Sensitivity): 3 ng/L (ref <18)
Troponin I (High Sensitivity): 4 ng/L (ref <18)

## 2023-07-14 ENCOUNTER — Encounter (HOSPITAL_COMMUNITY): Payer: Self-pay | Admitting: Emergency Medicine

## 2023-07-14 ENCOUNTER — Ambulatory Visit (HOSPITAL_COMMUNITY): Admission: EM | Admit: 2023-07-14 | Discharge: 2023-07-14 | Disposition: A | Discharge status: Home / Self Care

## 2023-07-14 DIAGNOSIS — S39011A Strain of muscle, fascia and tendon of abdomen, initial encounter: Secondary | ICD-10-CM

## 2023-07-14 DIAGNOSIS — G8929 Other chronic pain: Secondary | ICD-10-CM | POA: Diagnosis not present

## 2023-07-14 DIAGNOSIS — M545 Low back pain, unspecified: Secondary | ICD-10-CM

## 2023-07-14 LAB — POCT URINALYSIS DIP (MANUAL ENTRY)
Bilirubin, UA: NEGATIVE
Blood, UA: NEGATIVE
Glucose, UA: NEGATIVE mg/dL
Ketones, POC UA: NEGATIVE mg/dL
Leukocytes, UA: NEGATIVE
Nitrite, UA: NEGATIVE
Protein Ur, POC: NEGATIVE mg/dL
Spec Grav, UA: >=1.03 — AB (ref 1.010–1.025)
Urobilinogen, UA: 0.2 U/dL
pH, UA: 5.5 (ref 5.0–8.0)

## 2023-07-14 MED ORDER — BACLOFEN 10 MG PO TABS: 10.0000 mg | ORAL_TABLET | Freq: Three times a day (TID) | ORAL | 0 refills | Status: DC

## 2023-07-14 NOTE — ED Triage Notes (Signed)
 Pt reports lower right side of back has been hurting for a few days. 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  Tried using Lidocaine  patches on Saturday night.  Today when woke up having LLQ pain. Denies n/v, urinary or bowel.

## 2023-07-14 NOTE — Discharge Instructions (Signed)
 Take baclofen  3 times daily as needed for muscle pain and spasms. This can make you drowsy so do not work, drive, or drink alcohol while taking this. Alternate between 650 mg of Tylenol  and 400 mg of ibuprofen every 6-8 hours as needed for pain. You can continue to use lidocaine  patches to back and abdomen as needed. Otherwise apply heat and do gentle stretching to assist with pain.  Follow-up with [facility] if pain persists. Return here as needed.

## 2023-07-14 NOTE — ED Provider Notes (Signed)
 [facility]    CSN: [id number] Arrival date & time: 07/14/23  6578      History   Chief Complaint Chief Complaint  Patient presents with   Abdominal Pain   Back Pain    HPI Gabriel Walton is a 43 y.o. male.   Patient presents with chronic lower right-sided back pain that has become worse over the last few days.  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    Patient states that he woke up this morning with left lower abdominal pain that feels like he pulled something in his abdomen.  Denies any recent falls himself.  Patient states that he applied a lidocaine  patch to his lower back 2 nights ago with minimal relief.  Patient denies taking any other medications.  Patient reports that he does a lot of heavy lifting at work and thinks this could be related to with back was hurting prior to lifting yesterday.  The history is provided by the patient and medical records.  Abdominal Pain Back Pain Associated symptoms: abdominal pain     Past Medical History:  Diagnosis Date   Hyperlipidemia    Hypertension     There are no active problems to display for this patient.   Past Surgical History:  Procedure Laterality Date   APPENDECTOMY         Home Medications    Prior to Admission medications   Medication Sig Start Date End Date Taking? Authorizing Provider  amLODipine (NORVASC) 5 MG tablet Take 5 mg by mouth daily. 05/26/23  Yes [provider]  baclofen  (LIORESAL ) 10 MG tablet Take 1 tablet (10 mg total) by mouth 3 (three) times daily. 07/14/23  Yes Rosevelt Constable, Dadrian Ballantine A, NP  atorvastatin (LIPITOR) 20 MG tablet Take 20 mg by mouth at bedtime. 12/28/22   [provider]  naproxen  (NAPROSYN ) 500 MG tablet Take 1 tablet (500 mg total) by mouth 2 (two) times daily. 10/15/21   Bero, Michael M, MD  pantoprazole  (PROTONIX ) 20 MG tablet Take 1 tablet (20 mg  total) by mouth daily for 14 days. 05/23/21 06/06/21  Mesner, Jason, MD  valsartan-hydrochlorothiazide (DIOVAN-HCT) 80-12.5 MG tablet Take 1 tablet by mouth daily. 03/06/23   [provider]    Family History History reviewed. No pertinent family history.  Social History Social History   Tobacco Use   Smoking status: Former    Types: Cigarettes    Start date: 03/15/2013   Smokeless tobacco: Never  Vaping Use   Vaping status: Never Used  Substance Use Topics   Alcohol use: Yes    Alcohol/week: 6.0 standard drinks of alcohol    Types: 4 Cans of beer, 2 Standard drinks or equivalent per week    Comment: weekends occasionally   Drug use: Not Currently     Allergies   Patient has no known allergies.   Review of Systems Review of Systems  Gastrointestinal:  Positive for abdominal pain.  Musculoskeletal:  Positive for back pain.   Per HPI  Physical Exam Triage Vital Signs ED Triage Vitals  Encounter Vitals Group     BP 07/14/23 0824 (!) 136/93     Systolic BP Percentile --      Diastolic BP Percentile --      Pulse Rate 07/14/23 0824 61     Resp 07/14/23 0824 15     Temp 07/14/23 0824 97.8 F (  36.6 C)     Temp Source 07/14/23 0824 Oral     SpO2 07/14/23 0824 98 %     Weight --      Height --      Head Circumference --      Peak Flow --      Pain Score 07/14/23 0823 8     Pain Loc --      Pain Education --      Exclude from Growth Chart --    No data found.  Updated Vital Signs BP (!) 136/93 (BP Location: Right Arm)   Pulse 61   Temp 97.8 F (36.6 C) (Oral)   Resp 15   SpO2 98%   Visual Acuity Right Eye Distance:   Left Eye Distance:   Bilateral Distance:    Right Eye Near:   Left Eye Near:    Bilateral Near:     Physical Exam Vitals and nursing note reviewed.  Constitutional:      General: He is awake. He is not in acute distress.    Appearance: Normal appearance. He is well-developed and well-groomed. He is not ill-appearing.   Abdominal:     General: Abdomen is protuberant. Bowel sounds are normal. There is no distension. There are no signs of injury.     Palpations: Abdomen is soft.     Tenderness: There is abdominal tenderness in the left lower quadrant. There is no right CVA tenderness, left CVA tenderness, guarding or rebound.     Comments: Tenderness noted to left lower abdominal wall  Musculoskeletal:     Cervical back: Normal.     Thoracic back: Normal.     Lumbar back: Tenderness present. No swelling, deformity or bony tenderness. Normal range of motion. Negative right straight leg raise test and negative left straight leg raise test.     Comments: Tenderness noted to right low back  Skin:    General: Skin is warm and dry.  Neurological:     Mental Status: He is alert.  Psychiatric:        Behavior: Behavior is cooperative.      UC Treatments / Results  Labs (all labs ordered are listed, but only abnormal results are displayed) Labs Reviewed  POCT URINALYSIS DIP (MANUAL ENTRY) - Abnormal; Notable for the following components:      Result Value   Color, UA other (*)    Spec Grav, UA >=1.030 (*)    All other components within normal limits    EKG   Radiology No results found.  Procedures Procedures (including critical care time)  Medications Ordered in UC Medications - No data to display  Initial Impression / Assessment and Plan / UC Course  I have reviewed the triage vital signs and the nursing notes.  Pertinent labs & imaging results that were available during my care of the patient were reviewed by me and considered in my medical decision making (see chart for details).     Patient is well-appearing. Vitals stable. UA unremarkable. Tenderness noted to right low back and left lower abdominal pain. Likely muscular in nature. Prescribed baclofen  as needed for muscle pain and spasms. Recommended alternating Tylenol  and Ibuprofen as well as continuing lidocaine  patches. Provided work  note as requested. Discussed follow-up and return precautions.  Final Clinical Impressions(s) / UC Diagnoses   Final diagnoses:  Chronic right-sided low back pain without sciatica  Strain of abdominal muscle, initial encounter     Discharge Instructions  Take baclofen  3 times daily as needed for muscle pain and spasms. This can make you drowsy so do not work, drive, or drink alcohol while taking this. Alternate between 650 mg of Tylenol  and 400 mg of ibuprofen every 6-8 hours as needed for pain. You can continue to use lidocaine  patches to back and abdomen as needed. Otherwise apply heat and do gentle stretching to assist with pain.  Follow-up with [facility] if pain persists. Return here as needed.      ED Prescriptions     Medication Sig Dispense Auth. Provider   baclofen  (LIORESAL ) 10 MG tablet Take 1 tablet (10 mg total) by mouth 3 (three) times daily. 30 each Karon Packer, NP      PDMP not reviewed this encounter.   Levora Reas A, NP 07/14/23 [phone]

## 2023-10-13 ENCOUNTER — Encounter (HOSPITAL_COMMUNITY): Payer: Self-pay | Admitting: *Deleted

## 2023-10-13 ENCOUNTER — Ambulatory Visit (HOSPITAL_COMMUNITY)
Admission: EM | Admit: 2023-10-13 | Discharge: 2023-10-13 | Disposition: A | Discharge status: Home / Self Care | Attending: Family Medicine | Admitting: Family Medicine

## 2023-10-13 ENCOUNTER — Other Ambulatory Visit: Payer: Self-pay

## 2023-10-13 DIAGNOSIS — N200 Calculus of kidney: Secondary | ICD-10-CM | POA: Diagnosis not present

## 2023-10-13 DIAGNOSIS — R109 Unspecified abdominal pain: Secondary | ICD-10-CM | POA: Diagnosis not present

## 2023-10-13 LAB — URINALYSIS, ROUTINE W REFLEX MICROSCOPIC
Bilirubin Urine: NEGATIVE
Glucose, UA: 50 mg/dL — AB
Hgb urine dipstick: NEGATIVE
Ketones, ur: NEGATIVE mg/dL
Leukocytes,Ua: NEGATIVE
Nitrite: NEGATIVE
Protein, ur: NEGATIVE mg/dL
Specific Gravity, Urine: 1.017 (ref 1.005–1.030)
pH: 6 (ref 5.0–8.0)

## 2023-10-13 LAB — POCT URINALYSIS DIP (MANUAL ENTRY)
Bilirubin, UA: NEGATIVE
Blood, UA: NEGATIVE
Glucose, UA: 100 mg/dL — AB
Ketones, POC UA: NEGATIVE mg/dL
Leukocytes, UA: NEGATIVE
Nitrite, UA: NEGATIVE
Protein Ur, POC: NEGATIVE mg/dL
Spec Grav, UA: 1.02 (ref 1.010–1.025)
Urobilinogen, UA: 0.2 U/dL
pH, UA: 6 (ref 5.0–8.0)

## 2023-10-13 LAB — BASIC METABOLIC PANEL WITH GFR
Anion gap: 12 (ref 5–15)
BUN: 13 mg/dL (ref 6–20)
CO2: 23 mmol/L (ref 22–32)
Calcium: 9.5 mg/dL (ref 8.9–10.3)
Chloride: 101 mmol/L (ref 98–111)
Creatinine, Ser: 1.03 mg/dL (ref 0.61–1.24)
GFR, Estimated: >60 mL/min (ref >60)
Glucose, Bld: 74 mg/dL (ref 70–99)
Potassium: 4.1 mmol/L (ref 3.5–5.1)
Sodium: 136 mmol/L (ref 135–145)

## 2023-10-13 LAB — CBC
HCT: 46.5 % (ref 39.0–52.0)
Hemoglobin: 15 g/dL (ref 13.0–17.0)
MCH: 26.9 pg (ref 26.0–34.0)
MCHC: 32.3 g/dL (ref 30.0–36.0)
MCV: 83.5 fL (ref 80.0–100.0)
Platelets: 215 K/uL (ref 150–400)
RBC: 5.57 MIL/uL (ref 4.22–5.81)
RDW: 13.5 % (ref 11.5–15.5)
WBC: 4.7 K/uL (ref 4.0–10.5)
nRBC: 0 % (ref 0.0–0.2)

## 2023-10-13 MED ORDER — IBUPROFEN 800 MG PO TABS: 800.0000 mg | ORAL_TABLET | Freq: Three times a day (TID) | ORAL | 0 refills | Status: AC | PRN

## 2023-10-13 MED ORDER — TAMSULOSIN HCL 0.4 MG PO CAPS: 0.4000 mg | ORAL_CAPSULE | Freq: Every day | ORAL | 0 refills | Status: AC

## 2023-10-13 NOTE — ED Provider Notes (Signed)
 [facility]    CSN: [id number] Arrival date & time: 10/13/23  9185      History   Chief Complaint Chief Complaint  Patient presents with   Back Pain   Hematuria    HPI Gabriel Walton is a 43 y.o. male.    Back Pain Hematuria  Here for right low back pain/flank pain.  He first noted it last night.  He thought maybe it was due to lifting a heavy tire a few hours before.  He put a lidocaine  patch on it and it let him sleep all night.  Then this morning he noted some more left flank pain and then saw blood in his urine when he emptied his bladder this morning.  No fever or chills and no nausea or vomiting.  He did have a little dizziness about 7 days ago when he had been outside working in the heat.  That resolved spontaneously  The pain can last up to 2 minutes and is very sharp and when it is bothering him is 8 out of 10.  He thinks maybe some twisting movement of the torso could cause it.  No relation to eating.  NKDA  He does have hypertension and recently increase his amlodipine from 5 to 10 mg.  He is also taking valsartan HCT.  He is established with primary care at [facility]  Past Medical History:  Diagnosis Date   Hyperlipidemia    Hypertension     There are no active problems to display for this patient.   Past Surgical History:  Procedure Laterality Date   APPENDECTOMY         Home Medications    Prior to Admission medications   Medication Sig Start Date End Date Taking? Authorizing Provider  amLODipine (NORVASC) 5 MG tablet Take 5 mg by mouth daily. 05/26/23  Yes [provider]  atorvastatin (LIPITOR) 20 MG tablet Take 20 mg by mouth at bedtime. 12/28/22  Yes [provider]  ibuprofen  (ADVIL ) 800 MG tablet Take 1 tablet (800 mg total) by mouth every 8 (eight) hours as needed (pain). 10/13/23  Yes Vonna Sharlet POUR, MD  tamsulosin  (FLOMAX ) 0.4 MG CAPS capsule Take 1 capsule (0.4 mg total) by mouth daily. 10/13/23  Yes  Audon Heymann K, MD  valsartan-hydrochlorothiazide (DIOVAN-HCT) 80-12.5 MG tablet Take 1 tablet by mouth daily. 03/06/23  Yes [provider]    Family History History reviewed. No pertinent family history.  Social History Social History   Tobacco Use   Smoking status: Former    Types: Cigarettes    Start date: 03/15/2013   Smokeless tobacco: Never  Vaping Use   Vaping status: Never Used  Substance Use Topics   Alcohol use: Yes    Alcohol/week: 6.0 standard drinks of alcohol    Types: 4 Cans of beer, 2 Standard drinks or equivalent per week    Comment: weekends occasionally   Drug use: Not Currently     Allergies   Patient has no known allergies.   Review of Systems Review of Systems  Genitourinary:  Positive for hematuria.  Musculoskeletal:  Positive for back pain.     Physical Exam Triage Vital Signs ED Triage Vitals  Encounter Vitals Group     BP 10/13/23 0840 127/89     Girls Systolic BP Percentile --      Girls Diastolic BP Percentile --      Boys Systolic BP Percentile --      Boys Diastolic  BP Percentile --      Pulse Rate 10/13/23 0840 62     Resp 10/13/23 0840 18     Temp 10/13/23 0840 98.2 F (36.8 C)     Temp src --      SpO2 10/13/23 0840 97 %     Weight --      Height --      Head Circumference --      Peak Flow --      Pain Score 10/13/23 0838 8     Pain Loc --      Pain Education --      Exclude from Growth Chart --    No data found.  Updated Vital Signs BP 127/89   Pulse 62   Temp 98.2 F (36.8 C)   Resp 18   SpO2 97%   Visual Acuity Right Eye Distance:   Left Eye Distance:   Bilateral Distance:    Right Eye Near:   Left Eye Near:    Bilateral Near:     Physical Exam Vitals reviewed.  Constitutional:      General: He is not in acute distress.    Appearance: He is not toxic-appearing.  HENT:     Mouth/Throat:     Mouth: Mucous membranes are moist.     Pharynx: No oropharyngeal exudate or posterior  oropharyngeal erythema.  Eyes:     Extraocular Movements: Extraocular movements intact.     Conjunctiva/sclera: Conjunctivae normal.     Pupils: Pupils are equal, round, and reactive to light.  Cardiovascular:     Rate and Rhythm: Normal rate and regular rhythm.     Heart sounds: No murmur heard. Pulmonary:     Effort: Pulmonary effort is normal.     Breath sounds: Normal breath sounds.  Abdominal:     General: There is no distension.     Palpations: Abdomen is soft.     Tenderness: There is no abdominal tenderness. There is left CVA tenderness. There is no guarding.  Musculoskeletal:     Cervical back: Neck supple.  Lymphadenopathy:     Cervical: No cervical adenopathy.  Skin:    Coloration: Skin is not jaundiced or pale.  Neurological:     General: No focal deficit present.     Mental Status: He is alert and oriented to person, place, and time.  Psychiatric:        Behavior: Behavior normal.      UC Treatments / Results  Labs (all labs ordered are listed, but only abnormal results are displayed) Labs Reviewed  POCT URINALYSIS DIP (MANUAL ENTRY) - Abnormal; Notable for the following components:      Result Value   Glucose, UA =100 (*)    All other components within normal limits  URINALYSIS, ROUTINE W REFLEX MICROSCOPIC  CBC  BASIC METABOLIC PANEL WITH GFR    EKG   Radiology No results found.  Procedures Procedures (including critical care time)  Medications Ordered in UC Medications - No data to display  Initial Impression / Assessment and Plan / UC Course  I have reviewed the triage vital signs and the nursing notes.  Pertinent labs & imaging results that were available during my care of the patient were reviewed by me and considered in my medical decision making (see chart for details).     Urinalysis does not show any red blood cells at this time.  There is a tiny amount of glucose.  I still think he most likely  has a renal stone causing his  symptoms.  Urinalysis with micro is requested and urine culture is sent. Since he was dizzy last week, BMP and CBC are drawn.  Staff will let him know if anything is significantly abnormal.  We decided against any injectable pain relief today as he is not hurting terribly at this moment.  Ibuprofen  is sent in for pain and Flomax  sent in for urinary flow.  I have asked him drink plenty of water  I have asked him to follow-up with his primary care Final Clinical Impressions(s) / UC Diagnoses   Final diagnoses:  Left flank pain  Renal stone     Discharge Instructions      The urinalysis did not show any red blood cells at this moment.  There was a tiny amount of sugar in the urine.  The urine will be sent to the hospital lab to be examined under the microscope and a culture will be done if there is any abnormality on that.  Tamsulosin  0.4 mg--1 daily.  This is to help urinary flow  Take ibuprofen  800 mg--1 tab every 8 hours as needed for pain.  Drink a lot of fluids, mainly water.  Please follow-up with your primary care     ED Prescriptions     Medication Sig Dispense Auth. Provider   ibuprofen  (ADVIL ) 800 MG tablet Take 1 tablet (800 mg total) by mouth every 8 (eight) hours as needed (pain). 21 tablet Cierah Crader, Sharlet POUR, MD   tamsulosin  (FLOMAX ) 0.4 MG CAPS capsule Take 1 capsule (0.4 mg total) by mouth daily. 10 capsule Deborha Moseley K, MD      PDMP not reviewed this encounter.   Vonna Sharlet POUR, MD 10/13/23 [phone]

## 2023-10-13 NOTE — Discharge Instructions (Signed)
 The urinalysis did not show any red blood cells at this moment.  There was a tiny amount of sugar in the urine.  The urine will be sent to the hospital lab to be examined under the microscope and a culture will be done if there is any abnormality on that.  Tamsulosin  0.4 mg--1 daily.  This is to help urinary flow  Take ibuprofen  800 mg--1 tab every 8 hours as needed for pain.  Drink a lot of fluids, mainly water.  Please follow-up with your primary care

## 2023-10-13 NOTE — ED Triage Notes (Signed)
 PT reports back pain with blood in his urine. Pt reports he had a change in his BP med dose recently. Pt wants to know if the med change caused the hematuria and back pain.

## 2023-10-14 ENCOUNTER — Ambulatory Visit (HOSPITAL_COMMUNITY): Payer: Self-pay
